# Patient Record
Sex: Male | Born: 1990 | Race: Black or African American | Hispanic: No | Marital: Single | Smoking: Never smoker
Health system: Southern US, Community
[De-identification: ages and names within clinical notes are randomized; demographics above are authoritative.]

---
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
## 2013-08-10 ENCOUNTER — Emergency Department (HOSPITAL_COMMUNITY)
Admission: EM | Admit: 2013-08-10 | Discharge: 2013-08-10 | Disposition: A | Discharge status: Home / Self Care | Payer: No Typology Code available for payment source | Attending: Emergency Medicine | Admitting: Emergency Medicine

## 2013-08-10 ENCOUNTER — Encounter (HOSPITAL_COMMUNITY): Payer: Self-pay | Admitting: *Deleted

## 2013-08-10 DIAGNOSIS — S39012A Strain of muscle, fascia and tendon of lower back, initial encounter: Secondary | ICD-10-CM

## 2013-08-10 DIAGNOSIS — Y9389 Activity, other specified: Secondary | ICD-10-CM | POA: Insufficient documentation

## 2013-08-10 DIAGNOSIS — Y92410 Unspecified street and highway as the place of occurrence of the external cause: Secondary | ICD-10-CM | POA: Insufficient documentation

## 2013-08-10 DIAGNOSIS — R519 Headache, unspecified: Secondary | ICD-10-CM

## 2013-08-10 DIAGNOSIS — S0990XA Unspecified injury of head, initial encounter: Secondary | ICD-10-CM | POA: Insufficient documentation

## 2013-08-10 DIAGNOSIS — Z87891 Personal history of nicotine dependence: Secondary | ICD-10-CM | POA: Insufficient documentation

## 2013-08-10 DIAGNOSIS — IMO0002 Reserved for concepts with insufficient information to code with codable children: Secondary | ICD-10-CM | POA: Insufficient documentation

## 2013-08-10 DIAGNOSIS — Z79899 Other long term (current) drug therapy: Secondary | ICD-10-CM | POA: Insufficient documentation

## 2013-08-10 DIAGNOSIS — S139XXA Sprain of joints and ligaments of unspecified parts of neck, initial encounter: Secondary | ICD-10-CM | POA: Insufficient documentation

## 2013-08-10 DIAGNOSIS — S161XXA Strain of muscle, fascia and tendon at neck level, initial encounter: Secondary | ICD-10-CM

## 2013-08-10 MED ORDER — DIAZEPAM 5 MG PO TABS: 5.0000 mg | ORAL_TABLET | Freq: Two times a day (BID) | ORAL | Status: DC

## 2013-08-10 MED ORDER — IBUPROFEN 400 MG PO TABS: 400.0000 mg | ORAL_TABLET | Freq: Four times a day (QID) | ORAL | Status: DC | PRN
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
## 2013-08-14 NOTE — ED Provider Notes (Signed)
Medical screening examination/treatment/procedure(s) were performed by non-physician practitioner and as supervising physician I was immediately available for consultation/collaboration.  Joyelle Siedlecki, MD 08/14/13 1148 

## 2016-11-02 ENCOUNTER — Ambulatory Visit (HOSPITAL_COMMUNITY)
Admission: EM | Admit: 2016-11-02 | Discharge: 2016-11-02 | Disposition: A | Discharge status: Home / Self Care | Payer: Self-pay | Attending: Family Medicine | Admitting: Family Medicine

## 2016-11-02 ENCOUNTER — Encounter (HOSPITAL_COMMUNITY): Payer: Self-pay | Admitting: Emergency Medicine

## 2016-11-02 DIAGNOSIS — H1031 Unspecified acute conjunctivitis, right eye: Secondary | ICD-10-CM

## 2016-11-02 MED ORDER — TOBRAMYCIN 0.3 % OP SOLN
2.0000 [drp] | OPHTHALMIC | 0 refills | Status: DC
Start: 2016-11-02 — End: 2017-04-19

## 2016-11-02 NOTE — Discharge Instructions (Signed)
Return if any problems.

## 2016-11-02 NOTE — ED Triage Notes (Signed)
[facility] a [facility] of a right eye irritation x 4 days. The patient stated that he believed it to be due to sleeping in his contacts for an extended period of time.

## 2016-11-02 NOTE — ED Provider Notes (Signed)
CSN: [id number]     Arrival date & time 11/02/16  1431 History   None    Chief Complaint  Patient presents with  . Eye Problem   (Consider location/radiation/quality/duration/timing/severity/associated sxs/prior Treatment) No language interpreter was used.  Eye Problem  Location:  Right eye Quality:  Aching Severity:  Moderate Onset quality:  Gradual Duration:  4 days Timing:  Constant Progression:  Worsening Chronicity:  New Relieved by:  Nothing Worsened by:  Nothing Ineffective treatments:  None tried Associated symptoms: no blurred vision   Pt complains of redness to right eye.  Pt reports it started when he left contacts in to long.  Left eyelid small stye  History reviewed. No pertinent past medical history. History reviewed. No pertinent surgical history. History reviewed. No pertinent family history. Social History  Substance Use Topics  . Smoking status: Former Games developermoker  . Smokeless tobacco: Not on file  . Alcohol use No    Review of Systems  Eyes: Negative for blurred vision.  All other systems reviewed and are negative.   Allergies  Patient has no known allergies.  Home Medications   Prior to Admission medications   Medication Sig Start Date End Date Taking? Authorizing Provider  diazepam (VALIUM) 5 MG tablet Take 1 tablet (5 mg total) by mouth 2 (two) times daily. 08/10/13   Junius FinnerErin O'Malley, PA-C  ibuprofen (ADVIL,MOTRIN) 400 MG tablet Take 1 tablet (400 mg total) by mouth every 6 (six) hours as needed for pain. 08/10/13   Junius FinnerErin O'Malley, PA-C  tobramycin (TOBREX) 0.3 % ophthalmic solution Place 2 drops into both eyes every 4 (four) hours. 11/02/16   Elson AreasLeslie K Marlynn Hinckley, PA-C   Meds Ordered and Administered this Visit  Medications - No data to display  BP 115/76 (BP Location: Left Arm)   Pulse 81   Temp 98.3 F (36.8 C) (Oral)   Resp 16   SpO2 100%  No data found.   Physical Exam  Constitutional: He is oriented to person, place, and time. He appears  well-developed and well-nourished.  HENT:  Head: Normocephalic.  Right Ear: External ear normal.  Left Ear: External ear normal.  Eyes: EOM are normal.  Injected right conjuncitiva,  Left eyelid small stye mid eyelid  Neck: Normal range of motion.  Pulmonary/Chest: Effort normal.  Abdominal: He exhibits no distension.  Musculoskeletal: Normal range of motion.  Neurological: He is alert and oriented to person, place, and time.  Psychiatric: He has a normal mood and affect.  Nursing note and vitals reviewed.   Urgent Care Course   Clinical Course     Procedures (including critical care time)  Labs Review Labs Reviewed - No data to display  Imaging Review No results found.   Visual Acuity Review  Right Eye Distance: 20/50 Left Eye Distance: 20/50 Bilateral Distance: 20/50  Right Eye Near:   Left Eye Near:    Bilateral Near:         MDM   1. Acute conjunctivitis of right eye, unspecified acute conjunctivitis type    An After Visit Summary was printed and given to the patient. Meds ordered this encounter  Medications  . tobramycin (TOBREX) 0.3 % ophthalmic solution    Sig: Place 2 drops into both eyes every 4 (four) hours.    Dispense:  5 mL    Refill:  0    Order Specific Question:   Supervising Provider    Answer:   Bradd CanaryKINDL, JAMES D [[id number]]     Lonia SkinnerLeslie K  FillmoreSofia, PA-C 11/02/16 2101

## 2017-03-02 ENCOUNTER — Ambulatory Visit (HOSPITAL_COMMUNITY)
Admission: EM | Admit: 2017-03-02 | Discharge: 2017-03-02 | Disposition: A | Discharge status: Home / Self Care | Payer: Self-pay | Attending: Internal Medicine | Admitting: Internal Medicine

## 2017-03-02 ENCOUNTER — Encounter (HOSPITAL_COMMUNITY): Payer: Self-pay | Admitting: *Deleted

## 2017-03-02 DIAGNOSIS — B009 Herpesviral infection, unspecified: Secondary | ICD-10-CM

## 2017-03-02 DIAGNOSIS — Z711 Person with feared health complaint in whom no diagnosis is made: Secondary | ICD-10-CM

## 2017-03-02 DIAGNOSIS — N509 Disorder of male genital organs, unspecified: Secondary | ICD-10-CM

## 2017-03-02 DIAGNOSIS — B0089 Other herpesviral infection: Secondary | ICD-10-CM | POA: Insufficient documentation

## 2017-03-02 DIAGNOSIS — Z87891 Personal history of nicotine dependence: Secondary | ICD-10-CM | POA: Insufficient documentation

## 2017-03-02 DIAGNOSIS — Z79899 Other long term (current) drug therapy: Secondary | ICD-10-CM | POA: Insufficient documentation

## 2017-03-02 DIAGNOSIS — Z113 Encounter for screening for infections with a predominantly sexual mode of transmission: Secondary | ICD-10-CM

## 2017-03-02 DIAGNOSIS — Z202 Contact with and (suspected) exposure to infections with a predominantly sexual mode of transmission: Secondary | ICD-10-CM | POA: Insufficient documentation

## 2017-03-02 MED ORDER — VALACYCLOVIR HCL 1 G PO TABS: 1000.0000 mg | ORAL_TABLET | Freq: Three times a day (TID) | ORAL | 0 refills | Status: AC

## 2017-03-02 NOTE — Discharge Instructions (Signed)
You're being tested tonight for herpes, gonorrhea, chlamydia, and trichomoniasis. I have sent prescription to your pharmacy for Valtrex, take one tablet twice a day. You'll be notified of the results in 3-5 business days, if positive you will need to be treated, you can either return to this clinic, or go to the health department. I recommend safe sex practices either with abstinence, or use of a barrier methods such as condom.

## 2017-03-02 NOTE — ED Triage Notes (Signed)
Pt  Reports  Noticed  Red  Bumps  Under  Penis  X  1   Week   denys  Any  Other  Symptoms   Pt     Denies   Any  Discharge   Denies    Any  Burning  On  Urination      Appears   Alert  And  Oriented  Appearing  In no  Acute   Distress

## 2017-03-02 NOTE — ED Provider Notes (Signed)
CSN: [id number]     Arrival date & time 03/02/17  1926 History   None    Chief Complaint  Patient presents with  . Exposure to STD   (Consider location/radiation/quality/duration/timing/severity/associated sxs/prior Treatment) 26 year old male presents to clinic with chief complaint of having to "bumps" on his penis. He denies any pain, he denies any dysuria, frequency, penile discharge, testicle swelling or pain. He states he always uses condoms but is concerned he may have been exposed via oral sex. He has had no fever, malaise, nausea, or other symptoms.   The history is provided by the patient.  Exposure to STD  This is a new problem. The current episode started more than 2 days ago. Pertinent negatives include no abdominal pain. Nothing aggravates the symptoms. Nothing relieves the symptoms.    History reviewed. No pertinent past medical history. History reviewed. No pertinent surgical history. No family history on file. Social History  Substance Use Topics  . Smoking status: Former Games developer  . Smokeless tobacco: Not on file  . Alcohol use No    Review of Systems  Constitutional: Negative for activity change, chills and fever.  Gastrointestinal: Negative for abdominal pain, constipation, nausea and vomiting.  Genitourinary: Positive for genital sores. Negative for difficulty urinating, discharge, dysuria, flank pain, frequency, penile pain, testicular pain and urgency.  Musculoskeletal: Negative for myalgias, neck pain and neck stiffness.  Neurological: Negative for dizziness, weakness and numbness.  All other systems reviewed and are negative.   Allergies  Patient has no known allergies.  Home Medications   Prior to Admission medications   Medication Sig Start Date End Date Taking? Authorizing Provider  diazepam (VALIUM) 5 MG tablet Take 1 tablet (5 mg total) by mouth 2 (two) times daily. 08/10/13   Junius Finner, PA-C  ibuprofen (ADVIL,MOTRIN) 400 MG tablet Take 1 tablet  (400 mg total) by mouth every 6 (six) hours as needed for pain. 08/10/13   Junius Finner, PA-C  tobramycin (TOBREX) 0.3 % ophthalmic solution Place 2 drops into both eyes every 4 (four) hours. 11/02/16   Elson Areas, PA-C  valACYclovir (VALTREX) 1000 MG tablet Take 1 tablet (1,000 mg total) by mouth 3 (three) times daily. 03/02/17 03/16/17  Dorena Bodo, NP   Meds Ordered and Administered this Visit  Medications - No data to display  BP 120/70   Pulse 78   Temp 98.6 F (37 C) (Oral)   Resp 18   SpO2 100%  No data found.   Physical Exam  Constitutional: He is oriented to person, place, and time. He appears well-developed and well-nourished. No distress.  HENT:  Head: Normocephalic and atraumatic.  Cardiovascular: Normal rate and regular rhythm.   Pulmonary/Chest: Effort normal and breath sounds normal.  Abdominal: Soft. Bowel sounds are normal. He exhibits no distension. There is no tenderness.  Genitourinary: Penis normal. Right testis shows no mass, no swelling and no tenderness. Left testis shows no mass, no swelling and no tenderness. Circumcised. No discharge found.     Lymphadenopathy: No inguinal adenopathy noted on the right or left side.  Neurological: He is alert and oriented to person, place, and time.  Skin: Skin is warm and dry. Capillary refill takes less than 2 seconds. He is not diaphoretic.  Psychiatric: He has a normal mood and affect. His behavior is normal.  Nursing note and vitals reviewed.   Urgent Care Course     Procedures (including critical care time)  Labs Review Labs Reviewed  HSV CULTURE AND TYPING  URINE CYTOLOGY ANCILLARY ONLY    Imaging Review No results found.     MDM   1. Concern about STD in male without diagnosis   2. Herpetic lesion     Being tested for HSV, gonorrhea, chlamydia, and trichomoniasis. Prescription sent to the pharmacy for Valtrex based on the appearance of the lesion. Emphasized the importance of safe sex  practices. Follow-up with primary care, return to clinic if symptoms persist.     Dorena Bodo, NP 03/02/17 2030

## 2017-03-04 ENCOUNTER — Ambulatory Visit (HOSPITAL_COMMUNITY)
Admission: EM | Admit: 2017-03-04 | Discharge: 2017-03-04 | Discharge status: Absent without leave | Payer: No Typology Code available for payment source

## 2017-03-05 LAB — URINE CYTOLOGY ANCILLARY ONLY
CHLAMYDIA, DNA PROBE: NEGATIVE
NEISSERIA GONORRHEA: NEGATIVE
TRICH (WINDOWPATH): NEGATIVE

## 2017-03-05 LAB — HSV CULTURE AND TYPING

## 2017-04-19 ENCOUNTER — Encounter (HOSPITAL_COMMUNITY): Payer: Self-pay | Admitting: *Deleted

## 2017-04-19 ENCOUNTER — Ambulatory Visit (HOSPITAL_COMMUNITY)
Admission: EM | Admit: 2017-04-19 | Discharge: 2017-04-19 | Disposition: A | Discharge status: Home / Self Care | Payer: Self-pay | Attending: Internal Medicine | Admitting: Internal Medicine

## 2017-04-19 DIAGNOSIS — Z87891 Personal history of nicotine dependence: Secondary | ICD-10-CM | POA: Insufficient documentation

## 2017-04-19 DIAGNOSIS — Z711 Person with feared health complaint in whom no diagnosis is made: Secondary | ICD-10-CM

## 2017-04-19 DIAGNOSIS — Z202 Contact with and (suspected) exposure to infections with a predominantly sexual mode of transmission: Secondary | ICD-10-CM | POA: Insufficient documentation

## 2017-04-19 DIAGNOSIS — Z113 Encounter for screening for infections with a predominantly sexual mode of transmission: Secondary | ICD-10-CM

## 2017-04-19 NOTE — ED Provider Notes (Signed)
CSN: [id number]     Arrival date & time 04/19/17  1950 History   First MD Initiated Contact with Patient 04/19/17 2018     Chief Complaint  Patient presents with  . Exposure to STD   (Consider location/radiation/quality/duration/timing/severity/associated sxs/prior Treatment) [age] year old male states that his sexual partner told him that she tested positive for aches as feet and he wants to be tested. He is asymptomatic.      History reviewed. No pertinent past medical history. History reviewed. No pertinent surgical history. History reviewed. No pertinent family history. Social History  Substance Use Topics  . Smoking status: Former Games developermoker  . Smokeless tobacco: Not on file  . Alcohol use No    Review of Systems  Constitutional: Negative.   Genitourinary: Negative.   All other systems reviewed and are negative.   Allergies  Patient has no known allergies.  Home Medications   Prior to Admission medications   Not on File   Meds Ordered and Administered this Visit  Medications - No data to display  BP 128/81 (BP Location: Left Arm)   Pulse 78   Temp 98.1 F (36.7 C) (Oral)   Resp 16   SpO2 100%  No data found.   Physical Exam  Constitutional: He appears well-developed and well-nourished. No distress.  Eyes: EOM are normal.  Neck: Neck supple.  Cardiovascular: Normal rate.   Pulmonary/Chest: Effort normal. No respiratory distress.  Musculoskeletal: He exhibits no edema.  Neurological: He is alert. He exhibits normal muscle tone.  Skin: Skin is warm and dry.  Psychiatric: He has a normal mood and affect.  Nursing note and vitals reviewed.   Urgent Care Course     Procedures (including critical care time)  Labs Review Labs Reviewed  HSV 2 ANTIBODY, IGG  URINE CYTOLOGY ANCILLARY ONLY    Imaging Review No results found.   Visual Acuity Review  Right Eye Distance:   Left Eye Distance:   Bilateral Distance:    Right Eye Near:   Left Eye Near:     Bilateral Near:         MDM   1. Concern about STD in male without diagnosis    You are being tested for HSV and other STDs. He will be called for results in a few days. Recommend that she go to the health department for routine STD testing.     Hayden RasmussenMabe, Tawana Pasch, NP 04/19/17 2029

## 2017-04-19 NOTE — Discharge Instructions (Signed)
You are being tested for HSV and other STDs. He will be called for results in a few days. Recommend that she go to the health department for routine STD testing.

## 2017-04-19 NOTE — ED Triage Notes (Signed)
Pt    Reports       He   Was  Told  By  His  Partner  That   He  Was  Exposed  To  HSV     He  Denies  Any  Symptoms       He  Is  Awake  And  Alert   And  Oriented

## 2017-04-20 LAB — URINE CYTOLOGY ANCILLARY ONLY
CHLAMYDIA, DNA PROBE: NEGATIVE
NEISSERIA GONORRHEA: NEGATIVE
Trichomonas: NEGATIVE

## 2017-04-20 LAB — HSV 2 ANTIBODY, IGG

## 2017-06-28 ENCOUNTER — Ambulatory Visit (HOSPITAL_COMMUNITY)
Admission: EM | Admit: 2017-06-28 | Discharge: 2017-06-28 | Disposition: A | Discharge status: Home / Self Care | Payer: Self-pay | Attending: Emergency Medicine | Admitting: Emergency Medicine

## 2017-06-28 ENCOUNTER — Encounter (HOSPITAL_COMMUNITY): Payer: Self-pay

## 2017-06-28 DIAGNOSIS — R197 Diarrhea, unspecified: Secondary | ICD-10-CM

## 2017-06-28 MED ORDER — DICYCLOMINE HCL 20 MG PO TABS: 20.0000 mg | ORAL_TABLET | Freq: Two times a day (BID) | ORAL | 0 refills | Status: DC

## 2017-06-28 MED ORDER — LOPERAMIDE HCL 2 MG PO CAPS: 2.0000 mg | ORAL_CAPSULE | Freq: Four times a day (QID) | ORAL | 0 refills | Status: DC | PRN

## 2017-06-28 NOTE — Discharge Instructions (Signed)
You most likely have viral gastritis. I prescribed medications to help your symptoms. For abdominal pain and cramping I prescribed a medicine called Bentyl, take one tablet by mouth twice daily. For Diarrhea, I prescribed loperamide, one tablet after each loose stool, no more than 4 tablets a day.  Should your symptoms persist, or fail to improve, follow up with your primary care provider or return to clinic as needed. If your symptoms worsen, particularly if you develop worsened abdominal pain, fever, signs or symptoms of severe dehydration, then go to the emergency room.

## 2017-06-28 NOTE — ED Triage Notes (Signed)
Pt. Here for lower abdominal pain starting yesterday. Pt. Rates pain 8/10 aching. Pt. Denies N/V, but does endorse diarrhea since yesterday. Pt. Denies eating anything different.

## 2017-06-28 NOTE — ED Provider Notes (Signed)
CSN: [id number]     Arrival date & time 06/28/17  1050 History   First MD Initiated Contact with Patient 06/28/17 1111     Chief Complaint  Patient presents with  . Abdominal Pain   (Consider location/radiation/quality/duration/timing/severity/associated sxs/prior Treatment) Subjective:   Shawn Huff is a 26 y.o. male who presents for evaluation of abdominal pain. The pain is described as aching and cramping, and is 1/10 in intensity. Pain is located in the periumbilical bilateral without radiation. Onset was 1 day ago. Symptoms have been unchanged since. Aggravating factors: eating and movement.  Alleviating factors: bowel movements. Associated symptoms: diarrhea. The patient denies anorexia, constipation, fever, melena, nausea, sweats and vomiting. The following portions of the patient's history were reviewed and updated as appropriate: allergies, current medications, past family history, past medical history, past social history, past surgical history and problem list.  Review of Systems Pertinent items noted in HPI and remainder of comprehensive ROS otherwise negative.   Objective:  BP 121/80 (BP Location: Right Arm)   Pulse 77   Temp 98.1 F (36.7 C)   Resp 15   Ht 5\' 9"  (1.753 m)   Wt 140 lb (63.5 kg)   SpO2 100%   BMI 20.67 kg/m   General Appearance:    Alert, cooperative, no distress, appears stated age Head:    Normocephalic, without obvious abnormality, atraumatic Eyes:    PERRL, conjunctiva/corneas clear, EOM's intact, fundi    benign, both eyes      Ears:     external ear ear appears normal both ears Nose:   Nares normal, septum midline, mucosa normal, no drainage    or sinus tenderness Throat:   Lips, mucosa, and tongue normal; teeth and gums normal Neck:   Supple, symmetrical, trachea midline, no adenopathy;       JVD Back:     Symmetric, no curvature, ROM normal, no CVA tenderness Lungs:     Clear to auscultation bilaterally, respirations unlabored Chest  wall:    No tenderness or deformity Heart:    Regular rate and rhythm, S1 and S2 normal, no murmur, rub   or gallop Abdomen:     Soft, non-tender, bowel sounds active all four quadrants,    no masses, no organomegaly Extremities:   Extremities normal, atraumatic, no cyanosis or edema Pulses:   2+ and symmetric all extremities Skin:   Skin color, texture, turgor normal, no rashes or lesions Lymph nodes:   Cervical, supraclavicular, and axillary nodes normal          History reviewed. No pertinent past medical history. History reviewed. No pertinent surgical history. History reviewed. No pertinent family history. Social History  Substance Use Topics  . Smoking status: Never Smoker  . Smokeless tobacco: Never Used  . Alcohol use No    Review of Systems  Allergies  Patient has no known allergies.  Home Medications   Prior to Admission medications   Medication Sig Start Date End Date Taking? Authorizing Provider  dicyclomine (BENTYL) 20 MG tablet Take 1 tablet (20 mg total) by mouth 2 (two) times daily. 06/28/17   Dorena BodoKennard, Kaysen Sefcik, NP  loperamide (IMODIUM) 2 MG capsule Take 1 capsule (2 mg total) by mouth 4 (four) times daily as needed for diarrhea or loose stools. 06/28/17   Dorena BodoKennard, Nichele Slawson, NP   Meds Ordered and Administered this Visit  Medications - No data to display  BP 121/80 (BP Location: Right Arm)   Pulse 77   Temp 98.1 F (36.7 C)  Resp 15   Ht 5\' 9"  (1.753 m)   Wt 140 lb (63.5 kg)   SpO2 100%   BMI 20.67 kg/m  No data found.   Physical Exam  Urgent Care Course     Procedures (including critical care time)  Labs Review Labs Reviewed - No data to display  Imaging Review No results found.   Visual Acuity Review  Right Eye Distance:   Left Eye Distance:   Bilateral Distance:    Right Eye Near:   Left Eye Near:    Bilateral Near:         MDM   1. Diarrhea, unspecified type    Assessment:  Abdominal pain, non specific, with  Diarrhea, index of suspicion is low for appendicitis, peritonitis, or underlying surgical abdomen   Plan: The diagnosis was discussed with the patient and evaluation and treatment plans outlined. Reassured patient that symptoms are almost certainly benign and self-resolving. Adhere to simple, bland diet. Adhere to low fat diet. Follow up as needed. Started on Bentyl for cramping, recommend over-the-counter loperamide as needed for diarrhea, and some more than 4 tablets a day.      Dorena BodoKennard, Cadyn Fann, NP 06/28/17 1130

## 2017-08-25 ENCOUNTER — Encounter (HOSPITAL_COMMUNITY): Payer: Self-pay | Admitting: Family Medicine

## 2017-08-25 ENCOUNTER — Ambulatory Visit (HOSPITAL_COMMUNITY)
Admission: EM | Admit: 2017-08-25 | Discharge: 2017-08-25 | Disposition: A | Discharge status: Home / Self Care | Payer: Self-pay | Attending: Family | Admitting: Family

## 2017-08-25 DIAGNOSIS — Z202 Contact with and (suspected) exposure to infections with a predominantly sexual mode of transmission: Secondary | ICD-10-CM | POA: Insufficient documentation

## 2017-08-25 DIAGNOSIS — Z113 Encounter for screening for infections with a predominantly sexual mode of transmission: Secondary | ICD-10-CM

## 2017-08-25 DIAGNOSIS — N4889 Other specified disorders of penis: Secondary | ICD-10-CM

## 2017-08-25 MED ORDER — VALACYCLOVIR HCL 1 G PO TABS: 1000.0000 mg | ORAL_TABLET | Freq: Two times a day (BID) | ORAL | 0 refills | Status: AC
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
## 2017-08-25 NOTE — ED Provider Notes (Signed)
[facility]    CSN: [id number] Arrival date & time: 08/25/17  1941     History   Chief Complaint Chief Complaint  Patient presents with  . Exposure to STD    HPI Shawn Huff is a 26 y.o. male.   CC: blisters on penis x one day.  Tingling feeling. No h/o cold sores.  No penile discharge,penile or testicular pain, abdominal pain, fever.  Expsoure to male partner, same partner. No new exposure. She doesn't have any STDs  Had blisters 5 months ago however tested here and HSV negative. Would like to be tested and treated.       History reviewed. No pertinent past medical history.  There are no active problems to display for this patient.   History reviewed. No pertinent surgical history.     Home Medications    Prior to Admission medications   Medication Sig Start Date End Date Taking? Authorizing Provider  dicyclomine (BENTYL) 20 MG tablet Take 1 tablet (20 mg total) by mouth 2 (two) times daily. 06/28/17   Dorena Bodo, NP  loperamide (IMODIUM) 2 MG capsule Take 1 capsule (2 mg total) by mouth 4 (four) times daily as needed for diarrhea or loose stools. 06/28/17   Dorena Bodo, NP  valACYclovir (VALTREX) 1000 MG tablet Take 1 tablet (1,000 mg total) by mouth 2 (two) times daily. 08/25/17 09/01/17  Allegra Grana, FNP    Family History History reviewed. No pertinent family history.  Social History Social History  Substance Use Topics  . Smoking status: Never Smoker  . Smokeless tobacco: Never Used  . Alcohol use No     Allergies   Patient has no known allergies.   Review of Systems Review of Systems  Constitutional: Negative for chills and fever.  Respiratory: Negative for cough.   Cardiovascular: Negative for chest pain and palpitations.  Gastrointestinal: Negative for nausea and vomiting.  Genitourinary: Positive for genital sores. Negative for discharge, dysuria, hematuria, penile pain, penile swelling and scrotal  swelling.     Physical Exam Triage Vital Signs ED Triage Vitals [08/25/17 1958]  Enc Vitals Group     BP 120/79     Pulse Rate 86     Resp 18     Temp 98.5 F (36.9 C)     Temp src      SpO2 100 %     Weight      Height      Head Circumference      Peak Flow      Pain Score      Pain Loc      Pain Edu?      Excl. in [place]?    No data found.   Updated Vital Signs BP 120/79   Pulse 86   Temp 98.5 F (36.9 C)   Resp 18   SpO2 100%   Visual Acuity Right Eye Distance:   Left Eye Distance:   Bilateral Distance:    Right Eye Near:   Left Eye Near:    Bilateral Near:     Physical Exam  Constitutional: He appears well-developed and well-nourished.  Cardiovascular: Regular rhythm and normal heart sounds.   Pulmonary/Chest: Effort normal and breath sounds normal. No respiratory distress. He has no wheezes. He has no rhonchi. He has no rales.  Genitourinary: Circumcised. No penile erythema. No discharge found.     Genitourinary Comments: Ulcer noted on ventral aspect of penis. Approx 1 cm in diameter. No drainage.  Lymphadenopathy:       Head (left side): No submandibular and no preauricular adenopathy present.  Neurological: He is alert.  Skin: Skin is warm and dry.  Psychiatric: He has a normal mood and affect. His speech is normal and behavior is normal.  Vitals reviewed.    UC Treatments / Results  Labs (all labs ordered are listed, but only abnormal results are displayed) Labs Reviewed  HSV 2 ANTIBODY, IGG  HSV 1 ANTIBODY, IGG  URINE CYTOLOGY ANCILLARY ONLY    EKG  EKG Interpretation None       Radiology No results found.  Procedures Procedures (including critical care time)  Medications Ordered in UC Medications - No data to display   Initial Impression / Assessment and Plan / UC Course  I have reviewed the triage vital signs and the nursing notes.  Pertinent labs & imaging results that were available during my care of the patient  were reviewed by me and considered in my medical decision making (see chart for details).      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 Pending lab studies. In the interim, patient's preference was to go ahead and be treated with Valtrex. He politely declined any blood studies including HIV, syphilis, hepatitis. He would like gonorrhea and chlamydia testing as well this evening. Advised patient we'll call him with all results. Return precautions given.  New Prescriptions New Prescriptions   VALACYCLOVIR (VALTREX) 1000 MG TABLET    Take 1 tablet (1,000 mg total) by mouth 2 (two) times daily.     Controlled Substance Prescriptions [place] Controlled Substance Registry consulted? Not Applicable   Allegra Grana, [facility] 08/25/17 2055

## 2017-08-25 NOTE — ED Triage Notes (Signed)
Pt here for HIV screening.

## 2017-08-25 NOTE — Discharge Instructions (Signed)
We'll go ahead and treat empirically for herpes while we are waiting lab work. We will call you these results.  If there is no improvement in your symptoms, or if there is any worsening of symptoms, or if you have any additional concerns, please return for re-evaluation; or, if we are closed, consider going to the Emergency Room for evaluation if symptoms urgent.

## 2017-08-27 LAB — HSV 1 ANTIBODY, IGG: HSV 1 Glycoprotein G Ab, IgG: >62.2 {index} — ABNORMAL HIGH (ref 0.00–0.90)

## 2017-08-27 LAB — URINE CYTOLOGY ANCILLARY ONLY
Chlamydia: NEGATIVE
NEISSERIA GONORRHEA: NEGATIVE

## 2017-08-27 LAB — HSV 2 ANTIBODY, IGG: HSV 2 Glycoprotein G Ab, IgG: <0.91 {index} (ref 0.00–0.90)

## 2019-11-14 ENCOUNTER — Ambulatory Visit (INDEPENDENT_AMBULATORY_CARE_PROVIDER_SITE_OTHER): Payer: Self-pay

## 2019-11-14 ENCOUNTER — Ambulatory Visit (HOSPITAL_COMMUNITY): Payer: Self-pay

## 2019-11-14 ENCOUNTER — Other Ambulatory Visit: Payer: Self-pay

## 2019-11-14 ENCOUNTER — Ambulatory Visit (HOSPITAL_COMMUNITY)
Admission: EM | Admit: 2019-11-14 | Discharge: 2019-11-14 | Disposition: A | Discharge status: Home / Self Care | Payer: Self-pay | Attending: Family Medicine | Admitting: Family Medicine

## 2019-11-14 ENCOUNTER — Encounter (HOSPITAL_COMMUNITY): Payer: Self-pay | Admitting: Emergency Medicine

## 2019-11-14 DIAGNOSIS — X501XXA Overexertion from prolonged static or awkward postures, initial encounter: Secondary | ICD-10-CM

## 2019-11-14 DIAGNOSIS — S63641A Sprain of metacarpophalangeal joint of right thumb, initial encounter: Secondary | ICD-10-CM

## 2019-11-14 NOTE — ED Triage Notes (Signed)
Pt states over a week ago he caught his R hand in a door, states he does a lot of work involving his hands and hasn't been able to rest it much. Pt c/o ongoing pain.

## 2019-11-14 NOTE — Discharge Instructions (Signed)
Ice, elevate, use of wrap as placed for comfort.  Activity as tolerated.  Ibuprofen as needed for pain.  If pain continues to not improve or worsen in the next two weeks please follow up with orthopedics for further evaluation and management.

## 2019-11-14 NOTE — ED Provider Notes (Signed)
[facility]    CSN: [id number] Arrival date & time: 11/14/19  1928      History   Chief Complaint Chief Complaint  Patient presents with  . Hand Pain    HPI Shawn Huff is a 28 y.o. male.   Shawn Huff presents with complaints of right thumb pain. He over extended it while pushing open a door approximately 1.5 weeks ago and has had pain since then. Worse when he is working outside in the cold. Aches. Hasn't worsened but hasn't improved. Denies any previous injury to the thumb. Hasn't taken any medications for pain. Pain worse with flexion of the thumb. No numbness or tingling. No swelling or redness. Without contributing medical history.     ROS per HPI, negative if not otherwise mentioned.      History reviewed. No pertinent past medical history.  There are no problems to display for this patient.   History reviewed. No pertinent surgical history.     Home Medications    Prior to Admission medications   Medication Sig Start Date End Date Taking? Authorizing Provider  dicyclomine (BENTYL) 20 MG tablet Take 1 tablet (20 mg total) by mouth 2 (two) times daily. Patient not taking: Reported on 11/14/2019 06/28/17   Dorena Bodo, NP  loperamide (IMODIUM) 2 MG capsule Take 1 capsule (2 mg total) by mouth 4 (four) times daily as needed for diarrhea or loose stools. Patient not taking: Reported on 11/14/2019 06/28/17   Dorena Bodo, NP    Family History Family History  Problem Relation Age of Onset  . Diabetes Maternal Grandmother     Social History Social History   Tobacco Use  . Smoking status: Never Smoker  . Smokeless tobacco: Never Used  Substance Use Topics  . Alcohol use: No  . Drug use: No     Allergies   Patient has no known allergies.   Review of Systems Review of Systems   Physical Exam Triage Vital Signs ED Triage Vitals  Enc Vitals Group     BP 11/14/19 1943 126/81     Pulse Rate 11/14/19 1943 73   Resp 11/14/19 1943 18     Temp 11/14/19 1943 98.3 F (36.8 C)     Temp src --      SpO2 11/14/19 1943 100 %     Weight --      Height --      Head Circumference --      Peak Flow --      Pain Score 11/14/19 1945 6     Pain Loc --      Pain Edu? --      Excl. in [place]? --    No data found.  Updated Vital Signs BP 126/81   Pulse 73   Temp 98.3 F (36.8 C)   Resp 18   SpO2 100%    Physical Exam Constitutional:      Appearance: He is well-developed.  Cardiovascular:     Rate and Rhythm: Normal rate.  Pulmonary:     Effort: Pulmonary effort is normal.  Musculoskeletal:     Right wrist: Normal.     Right hand: Tenderness present. No swelling, deformity or lacerations. Decreased range of motion. Normal strength. Normal sensation. Normal pulse.     Comments: Tenderness to anterior and lateral right hand thumb with tenderness on palpation at MCP joint; pain with opposition and flexion at MCP joint; no redness warmth or swelling; cap refill < 2 seconds  Skin:    General: Skin is warm and dry.  Neurological:     Mental Status: He is alert and oriented to person, place, and time.      UC Treatments / Results  Labs (all labs ordered are listed, but only abnormal results are displayed) Labs Reviewed - No data to display  EKG   Radiology DG Finger Thumb Right  Result Date: 11/14/2019 CLINICAL DATA:  Right thumb pain EXAM: RIGHT THUMB 2+V COMPARISON:  None. FINDINGS: There is no evidence of fracture or dislocation. There is no evidence of arthropathy or other focal bone abnormality. Soft tissues are unremarkable. IMPRESSION: Negative. Electronically Signed   By: Davina Poke M.D.   On: 11/14/2019 20:13    Procedures Procedures (including critical care time)  Medications Ordered in UC Medications - No data to display  Initial Impression / Assessment and Plan / UC Course  I have reviewed the triage vital signs and the nursing notes.  Pertinent labs & imaging  results that were available during my care of the patient were reviewed by me and considered in my medical decision making (see chart for details).     H&P consistent with sprain to thumb. Ace applied for comfort and support. Activity as tolerated. Pain  Management discussed. Follow up with orthopedics recommended for persistent symptoms. Patient verbalized understanding and agreeable to plan.   Final Clinical Impressions(s) / UC Diagnoses   Final diagnoses:  Sprain of metacarpophalangeal (MCP) joint of right thumb, initial encounter     Discharge Instructions     Ice, elevate, use of wrap as placed for comfort.  Activity as tolerated.  Ibuprofen as needed for pain.  If pain continues to not improve or worsen in the next two weeks please follow up with orthopedics for further evaluation and management.     ED Prescriptions    None     PDMP not reviewed this encounter.   Zigmund Gottron, NP 11/14/19 2016

## 2021-10-27 ENCOUNTER — Emergency Department (HOSPITAL_COMMUNITY)
Admission: EM | Admit: 2021-10-27 | Discharge: 2021-10-27 | Disposition: A | Discharge status: Home / Self Care | Payer: No Typology Code available for payment source | Attending: Emergency Medicine | Admitting: Emergency Medicine

## 2021-10-27 ENCOUNTER — Encounter (HOSPITAL_COMMUNITY): Payer: Self-pay | Admitting: Pharmacy Technician

## 2021-10-27 ENCOUNTER — Emergency Department (HOSPITAL_COMMUNITY): Payer: No Typology Code available for payment source

## 2021-10-27 ENCOUNTER — Other Ambulatory Visit: Payer: Self-pay

## 2021-10-27 DIAGNOSIS — M542 Cervicalgia: Secondary | ICD-10-CM | POA: Insufficient documentation

## 2021-10-27 DIAGNOSIS — Y92410 Unspecified street and highway as the place of occurrence of the external cause: Secondary | ICD-10-CM | POA: Insufficient documentation

## 2021-10-27 DIAGNOSIS — S39012A Strain of muscle, fascia and tendon of lower back, initial encounter: Secondary | ICD-10-CM | POA: Diagnosis not present

## 2021-10-27 DIAGNOSIS — S3992XA Unspecified injury of lower back, initial encounter: Secondary | ICD-10-CM | POA: Diagnosis present

## 2021-10-27 NOTE — Discharge Instructions (Signed)
Take Tylenol or Motrin for pain control.  Follow-up with your primary doctor as needed.  Come back to ER if you develop chest pain, abdominal pain, or other new concerning symptom.
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
## 2022-12-28 ENCOUNTER — Ambulatory Visit (HOSPITAL_COMMUNITY)
Admission: EM | Admit: 2022-12-28 | Discharge: 2022-12-28 | Disposition: A | Discharge status: Home / Self Care | Payer: Self-pay | Attending: Family Medicine | Admitting: Family Medicine

## 2022-12-28 ENCOUNTER — Encounter (HOSPITAL_COMMUNITY): Payer: Self-pay

## 2022-12-28 DIAGNOSIS — S0181XA Laceration without foreign body of other part of head, initial encounter: Secondary | ICD-10-CM

## 2022-12-28 DIAGNOSIS — Z23 Encounter for immunization: Secondary | ICD-10-CM

## 2022-12-28 MED ORDER — LIDOCAINE-EPINEPHRINE 1 %-1:100000 IJ SOLN
INTRAMUSCULAR | Status: AC
  Filled 2022-12-28: qty 1

## 2022-12-28 MED ORDER — TETANUS-DIPHTH-ACELL PERTUSSIS 5-2.5-18.5 LF-MCG/0.5 IM SUSY: 0.5000 mL | PREFILLED_SYRINGE | Freq: Once | INTRAMUSCULAR | Status: DC

## 2022-12-28 MED ORDER — TETANUS-DIPHTH-ACELL PERTUSSIS 5-2.5-18.5 LF-MCG/0.5 IM SUSY
PREFILLED_SYRINGE | INTRAMUSCULAR | Status: AC
  Filled 2022-12-28: qty 0.5

## 2022-12-28 MED ORDER — TETANUS-DIPHTH-ACELL PERTUSSIS 5-2.5-18.5 LF-MCG/0.5 IM SUSY
0.5000 mL | PREFILLED_SYRINGE | Freq: Once | INTRAMUSCULAR | Status: AC
  Administered 2022-12-28: 0.5 mL via INTRAMUSCULAR
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
## 2022-12-28 NOTE — Discharge Instructions (Signed)
You have 3 sutures placed. They need removal in 3 to 5 days from today.  1-2 times daily clean the wound with peroxide or soapy water and put new Neosporin on.  Keep it clean and dry.  You have been given a Tdap tetanus booster tonight  Ice can help the soreness and swelling.  Also sleeping with your head elevated on pillows tonight and tomorrow would be good
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
